# Patient Record
Sex: Female | Born: 1985 | Race: White | Hispanic: No | Marital: Married | Smoking: Current every day smoker
Health system: Southern US, Community
[De-identification: ages and names within clinical notes are randomized; demographics above are authoritative.]

## PROBLEM LIST (undated history)

## (undated) DIAGNOSIS — J449 Chronic obstructive pulmonary disease, unspecified: Secondary | ICD-10-CM

## (undated) DIAGNOSIS — F32A Depression, unspecified: Secondary | ICD-10-CM

## (undated) DIAGNOSIS — G43909 Migraine, unspecified, not intractable, without status migrainosus: Secondary | ICD-10-CM

## (undated) DIAGNOSIS — I471 Supraventricular tachycardia, unspecified: Secondary | ICD-10-CM

## (undated) DIAGNOSIS — F419 Anxiety disorder, unspecified: Secondary | ICD-10-CM

## (undated) DIAGNOSIS — F329 Major depressive disorder, single episode, unspecified: Secondary | ICD-10-CM

---

## 2015-03-10 ENCOUNTER — Emergency Department (HOSPITAL_BASED_OUTPATIENT_CLINIC_OR_DEPARTMENT_OTHER)
Admission: EM | Admit: 2015-03-10 | Discharge: 2015-03-10 | Disposition: A | Discharge status: Home / Self Care | Payer: BC Managed Care – PPO | Attending: Emergency Medicine | Admitting: Emergency Medicine

## 2015-03-10 ENCOUNTER — Emergency Department (HOSPITAL_BASED_OUTPATIENT_CLINIC_OR_DEPARTMENT_OTHER): Payer: BC Managed Care – PPO

## 2015-03-10 ENCOUNTER — Encounter (HOSPITAL_BASED_OUTPATIENT_CLINIC_OR_DEPARTMENT_OTHER): Payer: Self-pay | Admitting: Emergency Medicine

## 2015-03-10 DIAGNOSIS — F329 Major depressive disorder, single episode, unspecified: Secondary | ICD-10-CM | POA: Insufficient documentation

## 2015-03-10 DIAGNOSIS — Z720 Tobacco use: Secondary | ICD-10-CM | POA: Insufficient documentation

## 2015-03-10 DIAGNOSIS — J449 Chronic obstructive pulmonary disease, unspecified: Secondary | ICD-10-CM | POA: Diagnosis not present

## 2015-03-10 DIAGNOSIS — G43909 Migraine, unspecified, not intractable, without status migrainosus: Secondary | ICD-10-CM | POA: Insufficient documentation

## 2015-03-10 DIAGNOSIS — F419 Anxiety disorder, unspecified: Secondary | ICD-10-CM | POA: Insufficient documentation

## 2015-03-10 DIAGNOSIS — R000 Tachycardia, unspecified: Secondary | ICD-10-CM | POA: Diagnosis not present

## 2015-03-10 DIAGNOSIS — J069 Acute upper respiratory infection, unspecified: Secondary | ICD-10-CM | POA: Diagnosis not present

## 2015-03-10 DIAGNOSIS — Z79899 Other long term (current) drug therapy: Secondary | ICD-10-CM | POA: Insufficient documentation

## 2015-03-10 DIAGNOSIS — R05 Cough: Secondary | ICD-10-CM | POA: Diagnosis present

## 2015-03-10 DIAGNOSIS — Z3202 Encounter for pregnancy test, result negative: Secondary | ICD-10-CM | POA: Insufficient documentation

## 2015-03-10 HISTORY — DX: Supraventricular tachycardia, unspecified: I47.10

## 2015-03-10 HISTORY — DX: Chronic obstructive pulmonary disease, unspecified: J44.9

## 2015-03-10 HISTORY — DX: Major depressive disorder, single episode, unspecified: F32.9

## 2015-03-10 HISTORY — DX: Anxiety disorder, unspecified: F41.9

## 2015-03-10 HISTORY — DX: Migraine, unspecified, not intractable, without status migrainosus: G43.909

## 2015-03-10 HISTORY — DX: Depression, unspecified: F32.A

## 2015-03-10 HISTORY — DX: Supraventricular tachycardia: I47.1

## 2015-03-10 LAB — CBC WITH DIFFERENTIAL/PLATELET
BASOS ABS: 0 10*3/uL (ref 0.0–0.1)
BASOS PCT: 0 % (ref 0–1)
Eosinophils Absolute: 0.3 10*3/uL (ref 0.0–0.7)
Eosinophils Relative: 2 % (ref 0–5)
HEMATOCRIT: 39.1 % (ref 36.0–46.0)
HEMOGLOBIN: 13.2 g/dL (ref 12.0–15.0)
LYMPHS PCT: 16 % (ref 12–46)
Lymphs Abs: 2.3 10*3/uL (ref 0.7–4.0)
MCH: 32 pg (ref 26.0–34.0)
MCHC: 33.8 g/dL (ref 30.0–36.0)
MCV: 94.7 fL (ref 78.0–100.0)
Monocytes Absolute: 0.9 10*3/uL (ref 0.1–1.0)
Monocytes Relative: 6 % (ref 3–12)
NEUTROS ABS: 10.9 10*3/uL — AB (ref 1.7–7.7)
NEUTROS PCT: 76 % (ref 43–77)
Platelets: 228 10*3/uL (ref 150–400)
RBC: 4.13 MIL/uL (ref 3.87–5.11)
RDW: 12.5 % (ref 11.5–15.5)
WBC: 14.5 10*3/uL — AB (ref 4.0–10.5)

## 2015-03-10 LAB — BASIC METABOLIC PANEL
ANION GAP: 8 (ref 5–15)
BUN: 9 mg/dL (ref 6–20)
CALCIUM: 9.4 mg/dL (ref 8.9–10.3)
CHLORIDE: 100 mmol/L — AB (ref 101–111)
CO2: 29 mmol/L (ref 22–32)
Creatinine, Ser: 0.78 mg/dL (ref 0.44–1.00)
GFR calc non Af Amer: >60 mL/min (ref >60)
GLUCOSE: 111 mg/dL — AB (ref 65–99)
POTASSIUM: 4.3 mmol/L (ref 3.5–5.1)
Sodium: 137 mmol/L (ref 135–145)

## 2015-03-10 LAB — D-DIMER, QUANTITATIVE: D-Dimer, Quant: 0.36 ug/mL-FEU (ref 0.00–0.48)

## 2015-03-10 LAB — PREGNANCY, URINE: PREG TEST UR: NEGATIVE

## 2015-03-10 MED ORDER — AZITHROMYCIN 250 MG PO TABS: ORAL_TABLET | ORAL | Status: AC

## 2015-03-10 MED ORDER — ALBUTEROL SULFATE (2.5 MG/3ML) 0.083% IN NEBU
5.0000 mg | INHALATION_SOLUTION | Freq: Once | RESPIRATORY_TRACT | Status: AC
  Administered 2015-03-10: 5 mg via RESPIRATORY_TRACT
  Filled 2015-03-10: qty 6

## 2015-03-10 NOTE — ED Notes (Signed)
Patient reports that "i think I have pneumonia because I am horrible with getting Pneumonia" - Patient however reports that she has nasal congestion and throat hurts when she coughs.

## 2015-03-10 NOTE — ED Provider Notes (Signed)
CSN: [id number]     Arrival date & time 03/10/15  1817 History   First MD Initiated Contact with Patient 03/10/15 1922     Chief Complaint  Patient presents with  . Cough     (Consider location/radiation/quality/duration/timing/severity/associated sxs/prior Treatment) HPI   Blood pressure 106/73, pulse 99, temperature 98.4 F (36.9 C), temperature source Oral, resp. rate 18, height 5\' 9"  (1.753 m), weight 132 lb (59.875 kg), last menstrual period 03/03/2015, SpO2 100 %.  Arriana Axelson is a 29 y.o. female with past medical history significant for SVT, COPD complaining of productive cough worsening over the course of 2 weeks with associated throat pain when she coughs and severe nasal congestion. States that she is concerned that she's getting pneumonia because she gets it yearly. She denies fever, chills, nausea, vomiting, chest pain. On review of systems she notes a generalized shortness of breath, but is not exacerbated with exertion. Patient takes oral contraception as regulate. She denies history of DVT PE, recent mobilizations, calf pain or leg swelling. 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  Past Medical History  Diagnosis Date  . COPD (chronic obstructive pulmonary disease)   . SVT (supraventricular tachycardia)   . Depression   . Migraines   . Anxiety    History reviewed. No pertinent past surgical history. History reviewed. No pertinent family history. Social History  Substance Use Topics  . Smoking status: Current Every Day Smoker  . Smokeless tobacco: None  . Alcohol Use: No   OB History    No data available     Review of Systems  10 systems reviewed and found to be negative, except as noted in the HPI.   Allergies  Review of patient's allergies indicates no known allergies.  Home Medications   Prior to Admission medications   Medication Sig Start Date End Date Taking? Authorizing Provider  albuterol-ipratropium  (COMBIVENT) 18-103 MCG/ACT inhaler Inhale 2 puffs into the lungs every 4 (four) hours.   Yes Historical Provider, MD  atenolol (TENORMIN) 50 MG tablet Take 50 mg by mouth daily.   Yes Historical Provider, MD  budesonide-formoterol (SYMBICORT) 160-4.5 MCG/ACT inhaler Inhale 2 puffs into the lungs 2 (two) times daily.   Yes Historical Provider, MD  LORazepam (ATIVAN) 1 MG tablet Take 1 mg by mouth every 8 (eight) hours.   Yes Historical Provider, MD  nortriptyline (PAMELOR) 50 MG capsule Take 100 mg by mouth at bedtime.   Yes Historical Provider, MD  venlafaxine (EFFEXOR) 37.5 MG tablet Take 37.5 mg by mouth 2 (two) times daily.   Yes Historical Provider, MD   BP 106/73 mmHg  Pulse 99  Temp(Src) 98.4 F (36.9 C) (Oral)  Resp 18  Ht 5\' 9"  (1.753 m)  Wt 132 lb (59.875 kg)  BMI 19.48 kg/m2  SpO2 100%  LMP 03/03/2015 Physical Exam  Constitutional: She is oriented to person, place, and time. She appears well-developed and well-nourished. No distress.  HENT:  Head: Normocephalic.  Mouth/Throat: Oropharynx is clear and moist.  Diffuse rhinorrhea, significant nasal mucosa edema.  No drooling or stridor. Posterior pharynx moderately erythematous no significant tonsillar hypertrophy. No exudate. Soft palate rises symmetrically. No TTP or induration under tongue.   No tenderness to palpation of frontal or bilateral maxillary sinuses.  Bilateral tympanic membranes with normal architecture and good light reflex.    Eyes: Conjunctivae are normal.  Neck: Normal range of motion. No JVD present. No tracheal deviation present.  Cardiovascular: Regular  rhythm and intact distal pulses.   Mild tachycardia   Radial pulse equal bilaterally  Pulmonary/Chest: Effort normal. No stridor. No respiratory distress. She has wheezes. She has no rales. She exhibits no tenderness.  Transmitted upper airway sounds  Scattered expiratory wheezing.  Abdominal: Soft. She exhibits no distension and no mass. There is  no tenderness. There is no rebound and no guarding.  Musculoskeletal: Normal range of motion. She exhibits no edema or tenderness.  No calf asymmetry, superficial collaterals, palpable cords, edema, Homans sign negative bilaterally.    Neurological: She is alert and oriented to person, place, and time.  Skin: Skin is warm. She is not diaphoretic.  Psychiatric: She has a normal mood and affect.  Nursing note and vitals reviewed.   ED Course  Procedures (including critical care time) Labs Review Labs Reviewed  CBC WITH DIFFERENTIAL/PLATELET - Abnormal; Notable for the following:    WBC 14.5 (*)    Neutro Abs 10.9 (*)    All other components within normal limits  BASIC METABOLIC PANEL - Abnormal; Notable for the following:    Chloride 100 (*)    Glucose, Bld 111 (*)    All other components within normal limits  PREGNANCY, URINE  D-DIMER, QUANTITATIVE (NOT AT [facility])    Imaging Review Dg Chest 2 View  03/10/2015   CLINICAL DATA:  Cough, chills.  EXAM: CHEST  2 VIEW  COMPARISON:  None.  FINDINGS: The heart size and mediastinal contours are within normal limits. Both lungs are clear. No pneumothorax or pleural effusion is noted. The visualized skeletal structures are unremarkable.  IMPRESSION: No active cardiopulmonary disease.   Electronically Signed   By: Lupita Raider, M.D.   On: 03/10/2015 19:11   I have personally reviewed and evaluated these images and lab results as part of my medical decision-making.   EKG Interpretation   Date/Time:  Sunday March 10 2015 19:59:29 EDT Ventricular Rate:  99 PR Interval:  158 QRS Duration: 84 QT Interval:  330 QTC Calculation: 423 R Axis:   40 Text Interpretation:  Normal sinus rhythm Possible Left atrial enlargement  Borderline ECG No previous ECGs available Confirmed by ZACKOWSKI  MD,  SCOTT [phone]) on 03/10/2015 8:10:48 PM      MDM   Final diagnoses:  URI (upper respiratory infection)    Filed Vitals:   03/10/15 1823  03/10/15 1945 03/10/15 2015 03/10/15 2110  BP: 109/78 106/73  115/72  Pulse: 120 99  106  Temp: 98.4 F (36.9 C)     TempSrc: Oral     Resp: Height:  (1.753 m)     Weight: 132 lb (59.875 kg)     SpO2: 100% 100% 100% 100%    Medications  albuterol (PROVENTIL) (2.5 MG/3ML) 0.083% nebulizer solution 5 mg (5 mg Nebulization Given 03/10/15 1954)    Betzayda Brodzinski is a pleasant 29 y.o. female presenting with cough, rhinorrhea, shortness of breath and throat pain on coughing. Patient is initially tachycardic to 120. She takes oral contraception is and will need a d-dimer. EKG shows a normal sinus rhythm. Chest x-rays without infiltrate. Patient is given an ureteral nebulizer with improvement in wheezing. dimer negative, patient's leukocytosis of 14.5. Patient with a history of COPD and active smoking, will start her on Z-Pak. Encouraged close follow-up with her primary care physician.   Evaluation does not show pathology that would require ongoing emergent intervention or inpatient treatment. Pt is hemodynamically stable and mentating appropriately. Discussed  findings and plan with patient/guardian, who agrees with care plan. All questions answered. Return precautions discussed and outpatient follow up given.   New Prescriptions   AZITHROMYCIN (ZITHROMAX Z-PAK) 250 MG TABLET    2 po day one, then 1 daily x 4 days         Wynetta Emery, PA-C 03/10/15 2113  Vanetta Mulders, MD 03/13/15 [phone]

## 2015-03-10 NOTE — Discharge Instructions (Signed)
Use nasal saline (you can try [employer] Simply Saline) at least 4 times a day, use saline 5-10 minutes before using the fluticasone (flonase) nasal spray  Do not use Afrin (Oxymetazoline)  Rest, wash hands frequently  and drink plenty of water.  You may try counter medication such as Mucinex or Sudafed decongestant.  Please follow with your primary care doctor in the next 2 days for a check-up. They must obtain records for further management.   Do not hesitate to return to the Emergency Department for any new, worsening or concerning symptoms.   Upper Respiratory Infection, Adult An upper respiratory infection (URI) is also sometimes known as the common cold. The upper respiratory tract includes the nose, sinuses, throat, trachea, and bronchi. Bronchi are the airways leading to the lungs. Most people improve within 1 week, but symptoms can last up to 2 weeks. A residual cough may last even longer.  CAUSES Many different viruses can infect the tissues lining the upper respiratory tract. The tissues become irritated and inflamed and often become very moist. Mucus production is also common. A cold is contagious. You can easily spread the virus to others by oral contact. This includes kissing, sharing a glass, coughing, or sneezing. Touching your mouth or nose and then touching a surface, which is then touched by another person, can also spread the virus. SYMPTOMS  Symptoms typically develop 1 to 3 days after you come in contact with a cold virus. Symptoms vary from person to person. They may include:  Runny nose.  Sneezing.  Nasal congestion.  Sinus irritation.  Sore throat.  Loss of voice (laryngitis).  Cough.  Fatigue.  Muscle aches.  Loss of appetite.  Headache.  Low-grade fever. DIAGNOSIS  You might diagnose your own cold based on familiar symptoms, since most people get a cold 2 to 3 times a year. Your caregiver can confirm this based on your exam. Most importantly,  your caregiver can check that your symptoms are not due to another disease such as strep throat, sinusitis, pneumonia, asthma, or epiglottitis. Blood tests, throat tests, and X-rays are not necessary to diagnose a common cold, but they may sometimes be helpful in excluding other more serious diseases. Your caregiver will decide if any further tests are required. RISKS AND COMPLICATIONS  You may be at risk for a more severe case of the common cold if you smoke cigarettes, have chronic heart disease (such as heart failure) or lung disease (such as asthma), or if you have a weakened immune system. The very young and very old are also at risk for more serious infections. Bacterial sinusitis, middle ear infections, and bacterial pneumonia can complicate the common cold. The common cold can worsen asthma and chronic obstructive pulmonary disease (COPD). Sometimes, these complications can require emergency medical care and may be life-threatening. PREVENTION  The best way to protect against getting a cold is to practice good hygiene. Avoid oral or hand contact with people with cold symptoms. Wash your hands often if contact occurs. There is no clear evidence that vitamin C, vitamin E, echinacea, or exercise reduces the chance of developing a cold. However, it is always recommended to get plenty of rest and practice good nutrition. TREATMENT  Treatment is directed at relieving symptoms. There is no cure. Antibiotics are not effective, because the infection is caused by a virus, not by bacteria. Treatment may include:  Increased fluid intake. Sports drinks offer valuable electrolytes, sugars, and fluids.  Breathing heated mist or steam (  vaporizer or shower).  Eating chicken soup or other clear broths, and maintaining good nutrition.  Getting plenty of rest.  Using gargles or lozenges for comfort.  Controlling fevers with ibuprofen or acetaminophen as directed by your caregiver.  Increasing usage of your  inhaler if you have asthma. Zinc gel and zinc lozenges, taken in the first 24 hours of the common cold, can shorten the duration and lessen the severity of symptoms. Pain medicines may help with fever, muscle aches, and throat pain. A variety of non-prescription medicines are available to treat congestion and runny nose. Your caregiver can make recommendations and may suggest nasal or lung inhalers for other symptoms.  HOME CARE INSTRUCTIONS   Only take over-the-counter or prescription medicines for pain, discomfort, or fever as directed by your caregiver.  Use a warm mist humidifier or inhale steam from a shower to increase air moisture. This may keep secretions moist and make it easier to breathe.  Drink enough water and fluids to keep your urine clear or pale yellow.  Rest as needed.  Return to work when your temperature has returned to normal or as your caregiver advises. You may need to stay home longer to avoid infecting others. You can also use a face mask and careful hand washing to prevent spread of the virus. SEEK MEDICAL CARE IF:   After the first few days, you feel you are getting worse rather than better.  You need your caregiver's advice about medicines to control symptoms.  You develop chills, worsening shortness of breath, or brown or red sputum. These may be signs of pneumonia.  You develop yellow or brown nasal discharge or pain in the face, especially when you bend forward. These may be signs of sinusitis.  You develop a fever, swollen neck glands, pain with swallowing, or white areas in the back of your throat. These may be signs of strep throat. SEEK IMMEDIATE MEDICAL CARE IF:   You have a fever.  You develop severe or persistent headache, ear pain, sinus pain, or chest pain.  You develop wheezing, a prolonged cough, cough up blood, or have a change in your usual mucus (if you have chronic lung disease).  You develop sore muscles or a stiff neck. Document  Released: 12/09/2000 Document Revised: 09/07/2011 Document Reviewed: 09/20/2013 [facility] Patient Information 2015 [place], [place]. This information is not intended to replace advice given to you by your health care provider. Make sure you discuss any questions you have with your health care provider.  Upper Respiratory Infection, Adult An upper respiratory infection (URI) is also sometimes known as the common cold. The upper respiratory tract includes the nose, sinuses, throat, trachea, and bronchi. Bronchi are the airways leading to the lungs. Most people improve within 1 week, but symptoms can last up to 2 weeks. A residual cough may last even longer.  CAUSES Many different viruses can infect the tissues lining the upper respiratory tract. The tissues become irritated and inflamed and often become very moist. Mucus production is also common. A cold is contagious. You can easily spread the virus to others by oral contact. This includes kissing, sharing a glass, coughing, or sneezing. Touching your mouth or nose and then touching a surface, which is then touched by another person, can also spread the virus. SYMPTOMS  Symptoms typically develop 1 to 3 days after you come in contact with a cold virus. Symptoms vary from person to person. They may include:  Runny nose.  Sneezing.  Nasal congestion.  Sinus irritation.  Sore throat.  Loss of voice (laryngitis).  Cough.  Fatigue.  Muscle aches.  Loss of appetite.  Headache.  Low-grade fever. DIAGNOSIS  You might diagnose your own cold based on familiar symptoms, since most people get a cold 2 to 3 times a year. Your caregiver can confirm this based on your exam. Most importantly, your caregiver can check that your symptoms are not due to another disease such as strep throat, sinusitis, pneumonia, asthma, or epiglottitis. Blood tests, throat tests, and X-rays are not necessary to diagnose a common cold, but they may sometimes be helpful in  excluding other more serious diseases. Your caregiver will decide if any further tests are required. RISKS AND COMPLICATIONS  You may be at risk for a more severe case of the common cold if you smoke cigarettes, have chronic heart disease (such as heart failure) or lung disease (such as asthma), or if you have a weakened immune system. The very young and very old are also at risk for more serious infections. Bacterial sinusitis, middle ear infections, and bacterial pneumonia can complicate the common cold. The common cold can worsen asthma and chronic obstructive pulmonary disease (COPD). Sometimes, these complications can require emergency medical care and may be life-threatening. PREVENTION  The best way to protect against getting a cold is to practice good hygiene. Avoid oral or hand contact with people with cold symptoms. Wash your hands often if contact occurs. There is no clear evidence that vitamin C, vitamin E, echinacea, or exercise reduces the chance of developing a cold. However, it is always recommended to get plenty of rest and practice good nutrition. TREATMENT  Treatment is directed at relieving symptoms. There is no cure. Antibiotics are not effective, because the infection is caused by a virus, not by bacteria. Treatment may include:  Increased fluid intake. Sports drinks offer valuable electrolytes, sugars, and fluids.  Breathing heated mist or steam (vaporizer or shower).  Eating chicken soup or other clear broths, and maintaining good nutrition.  Getting plenty of rest.  Using gargles or lozenges for comfort.  Controlling fevers with ibuprofen or acetaminophen as directed by your caregiver.  Increasing usage of your inhaler if you have asthma. Zinc gel and zinc lozenges, taken in the first 24 hours of the common cold, can shorten the duration and lessen the severity of symptoms. Pain medicines may help with fever, muscle aches, and throat pain. A variety of non-prescription  medicines are available to treat congestion and runny nose. Your caregiver can make recommendations and may suggest nasal or lung inhalers for other symptoms.  HOME CARE INSTRUCTIONS   Only take over-the-counter or prescription medicines for pain, discomfort, or fever as directed by your caregiver.  Use a warm mist humidifier or inhale steam from a shower to increase air moisture. This may keep secretions moist and make it easier to breathe.  Drink enough water and fluids to keep your urine clear or pale yellow.  Rest as needed.  Return to work when your temperature has returned to normal or as your caregiver advises. You may need to stay home longer to avoid infecting others. You can also use a face mask and careful hand washing to prevent spread of the virus. SEEK MEDICAL CARE IF:   After the first few days, you feel you are getting worse rather than better.  You need your caregiver's advice about medicines to control symptoms.  You develop chills, worsening shortness of breath, or brown or red sputum. These  may be signs of pneumonia.  You develop yellow or brown nasal discharge or pain in the face, especially when you bend forward. These may be signs of sinusitis.  You develop a fever, swollen neck glands, pain with swallowing, or white areas in the back of your throat. These may be signs of strep throat. SEEK IMMEDIATE MEDICAL CARE IF:   You have a fever.  You develop severe or persistent headache, ear pain, sinus pain, or chest pain.  You develop wheezing, a prolonged cough, cough up blood, or have a change in your usual mucus (if you have chronic lung disease).  You develop sore muscles or a stiff neck. Document Released: 12/09/2000 Document Revised: 09/07/2011 Document Reviewed: 09/20/2013 [facility] Patient Information 2015 [place], [place]. This information is not intended to replace advice given to you by your health care provider. Make sure you discuss any questions you have  with your health care provider.

## 2016-09-01 IMAGING — DX DG CHEST 2V
2 series · 2 of 2 positions shown · non-contrast
Comparison: None.

CLINICAL DATA: Cough, chills.

EXAM:
CHEST  2 VIEW

[chest pa]
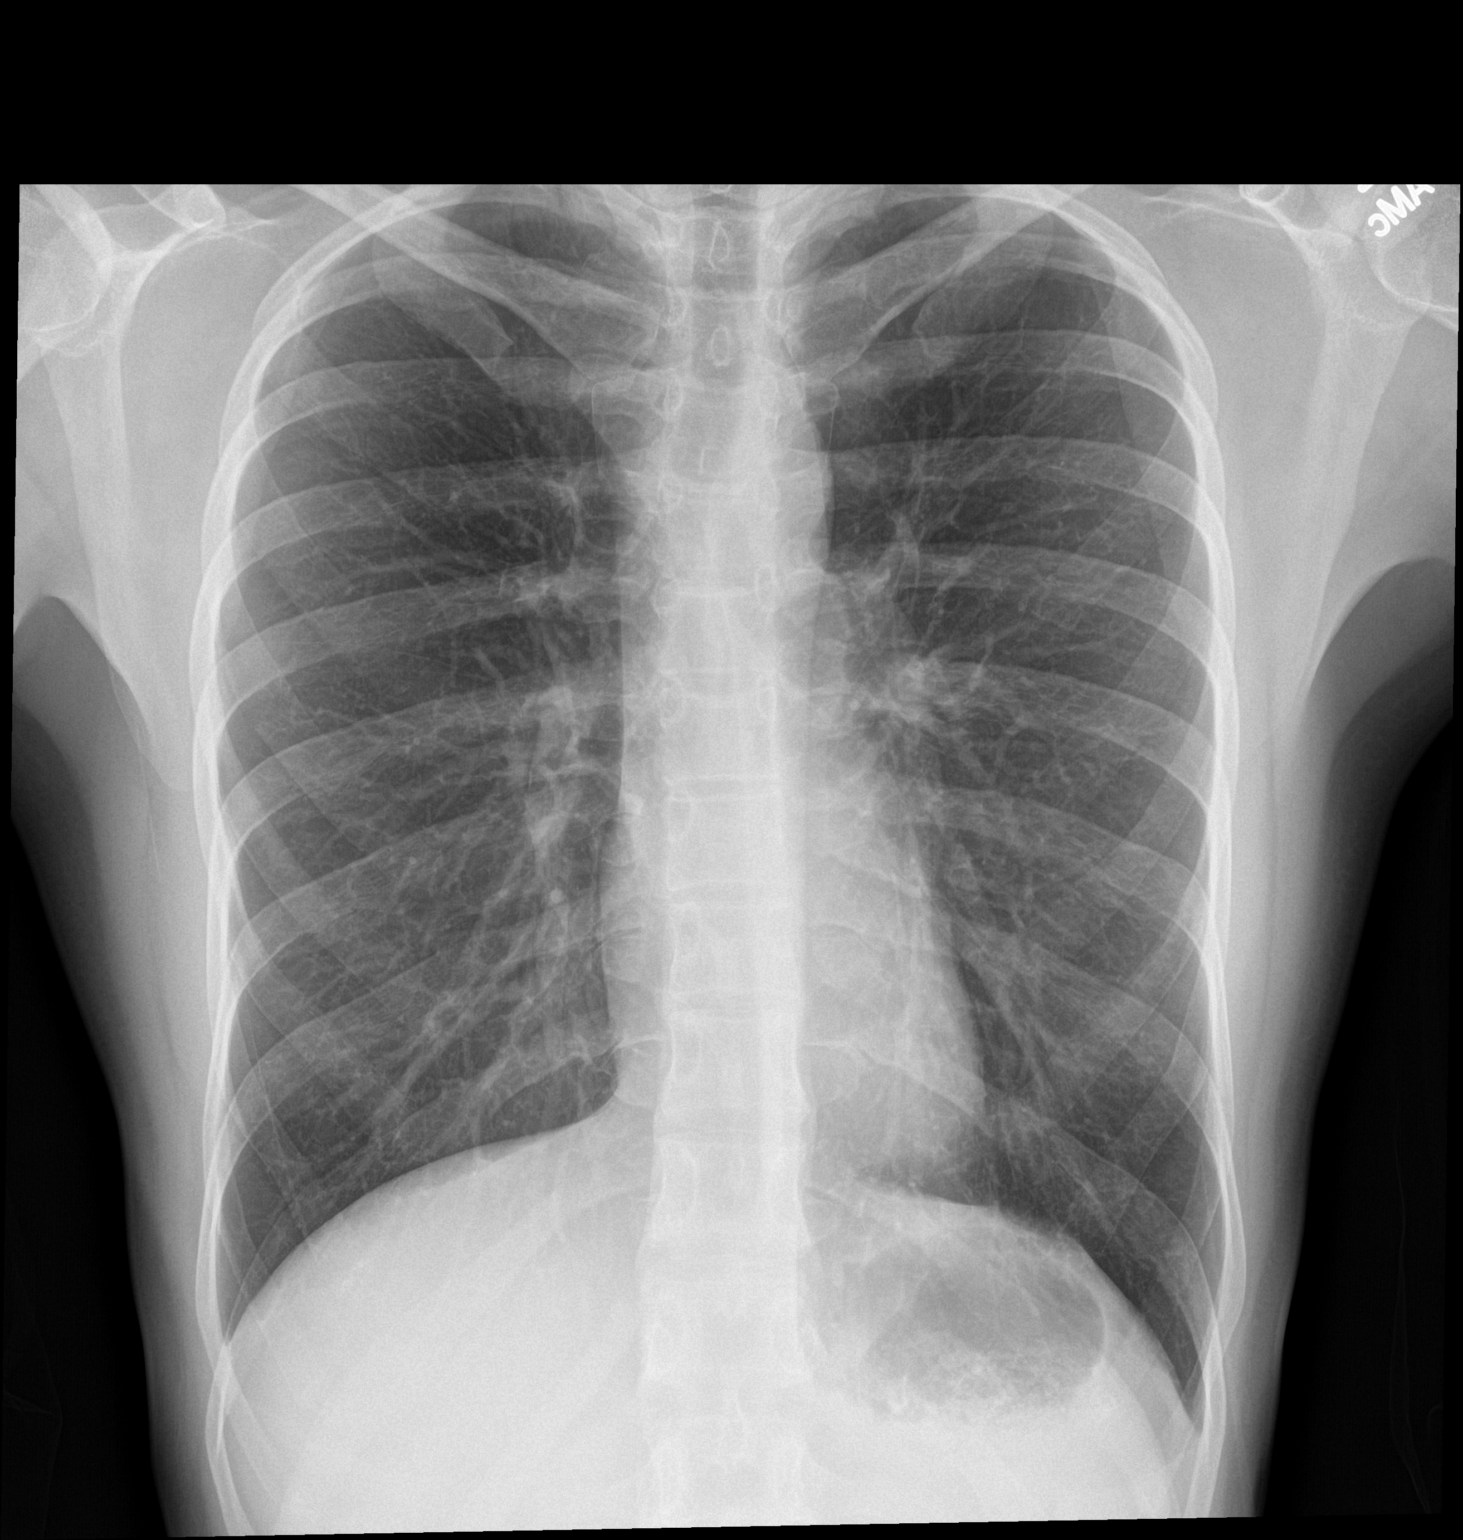

[chest lat]
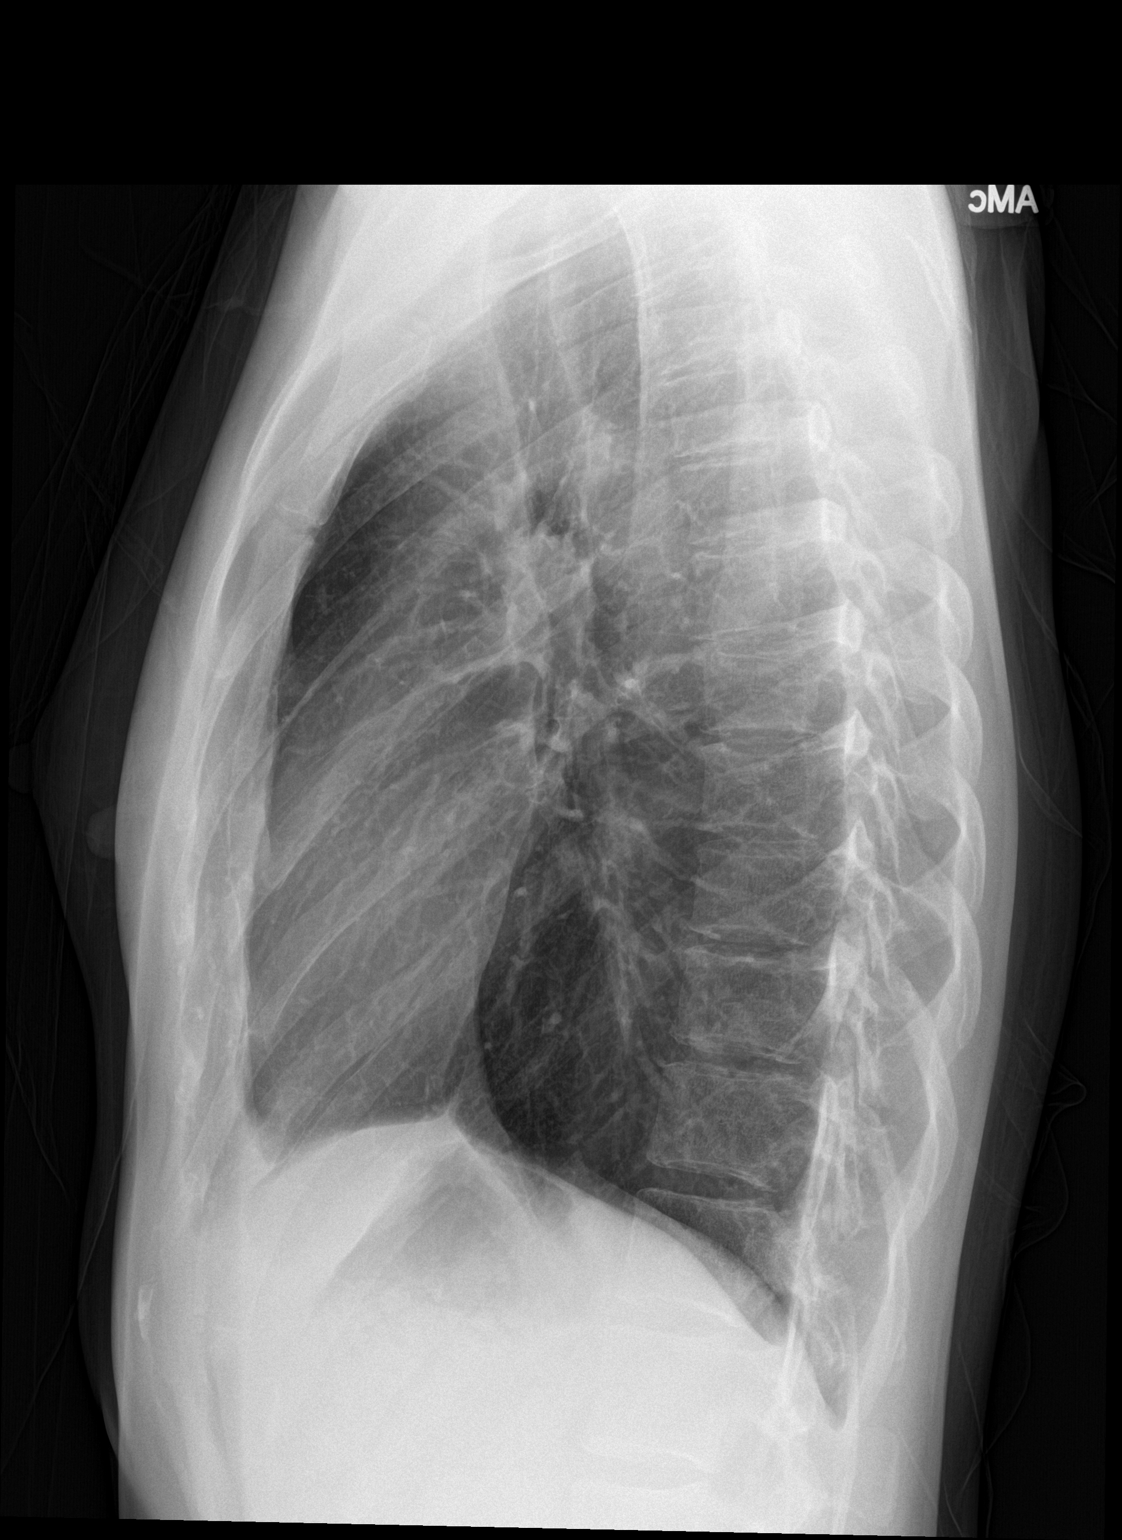

[2 of 2 positions shown; findings below may reference images not displayed]

FINDINGS: The heart size and mediastinal contours are within normal limits.
Both lungs are clear. No pneumothorax or pleural effusion is noted.
The visualized skeletal structures are unremarkable.
IMPRESSION: No active cardiopulmonary disease.
